# Patient Record
Sex: Male | Born: 1995 | Race: White | Hispanic: No | Marital: Single | Smoking: Current every day smoker
Health system: Southern US, Community
[De-identification: ages and names within clinical notes are randomized; demographics above are authoritative.]

---
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
## 2017-06-21 ENCOUNTER — Emergency Department: Payer: 59

## 2017-06-21 ENCOUNTER — Emergency Department
Admission: EM | Admit: 2017-06-21 | Discharge: 2017-06-21 | Disposition: A | Discharge status: Home / Self Care | Payer: 59 | Attending: Emergency Medicine | Admitting: Emergency Medicine

## 2017-06-21 ENCOUNTER — Encounter: Payer: Self-pay | Admitting: Emergency Medicine

## 2017-06-21 DIAGNOSIS — F17210 Nicotine dependence, cigarettes, uncomplicated: Secondary | ICD-10-CM | POA: Insufficient documentation

## 2017-06-21 DIAGNOSIS — N50819 Testicular pain, unspecified: Secondary | ICD-10-CM

## 2017-06-21 DIAGNOSIS — N453 Epididymo-orchitis: Secondary | ICD-10-CM | POA: Diagnosis not present

## 2017-06-21 DIAGNOSIS — N50811 Right testicular pain: Secondary | ICD-10-CM | POA: Diagnosis present

## 2017-06-21 LAB — COMPREHENSIVE METABOLIC PANEL
ALT: 20 U/L (ref 17–63)
AST: 31 U/L (ref 15–41)
Albumin: 4.3 g/dL (ref 3.5–5.0)
Alkaline Phosphatase: 80 U/L (ref 38–126)
Anion gap: 10 (ref 5–15)
BUN: 14 mg/dL (ref 6–20)
CALCIUM: 9.2 mg/dL (ref 8.9–10.3)
CHLORIDE: 105 mmol/L (ref 101–111)
CO2: 25 mmol/L (ref 22–32)
Creatinine, Ser: 0.91 mg/dL (ref 0.61–1.24)
GFR calc Af Amer: >60 mL/min (ref >60)
GFR calc non Af Amer: >60 mL/min (ref >60)
Glucose, Bld: 107 mg/dL — ABNORMAL HIGH (ref 65–99)
Potassium: 3.5 mmol/L (ref 3.5–5.1)
Sodium: 140 mmol/L (ref 135–145)
Total Bilirubin: 0.8 mg/dL (ref 0.3–1.2)
Total Protein: 7.3 g/dL (ref 6.5–8.1)

## 2017-06-21 LAB — CBC
HCT: 44.3 % (ref 40.0–52.0)
Hemoglobin: 15 g/dL (ref 13.0–18.0)
MCH: 31.6 pg (ref 26.0–34.0)
MCHC: 33.9 g/dL (ref 32.0–36.0)
MCV: 93.3 fL (ref 80.0–100.0)
Platelets: 189 10*3/uL (ref 150–440)
RBC: 4.75 MIL/uL (ref 4.40–5.90)
RDW: 13.5 % (ref 11.5–14.5)
WBC: 8.4 10*3/uL (ref 3.8–10.6)

## 2017-06-21 LAB — URINALYSIS, COMPLETE (UACMP) WITH MICROSCOPIC
Bacteria, UA: NONE SEEN
Bilirubin Urine: NEGATIVE
Glucose, UA: NEGATIVE mg/dL
KETONES UR: NEGATIVE mg/dL
Nitrite: NEGATIVE
PROTEIN: NEGATIVE mg/dL
Specific Gravity, Urine: 1.015 (ref 1.005–1.030)
pH: 5 (ref 5.0–8.0)

## 2017-06-21 LAB — CHLAMYDIA/NGC RT PCR (ARMC ONLY)
Chlamydia Tr: DETECTED — AB
N gonorrhoeae: NOT DETECTED

## 2017-06-21 LAB — LIPASE, BLOOD: LIPASE: 24 U/L (ref 11–51)

## 2017-06-21 MED ORDER — CEFTRIAXONE SODIUM 250 MG IJ SOLR
250.0000 mg | Freq: Once | INTRAMUSCULAR | Status: AC
  Administered 2017-06-21: 250 mg via INTRAMUSCULAR
  Filled 2017-06-21: qty 250

## 2017-06-21 MED ORDER — AZITHROMYCIN 500 MG PO TABS
1000.0000 mg | ORAL_TABLET | Freq: Once | ORAL | Status: AC
  Administered 2017-06-21: 1000 mg via ORAL
  Filled 2017-06-21: qty 2

## 2017-06-21 MED ORDER — DOXYCYCLINE HYCLATE 100 MG PO CAPS: 100.0000 mg | ORAL_CAPSULE | Freq: Two times a day (BID) | ORAL | 0 refills | Status: DC

## 2017-06-21 NOTE — ED Provider Notes (Signed)
[facility] Emergency Department Provider Note       Time seen: ----------------------------------------- 7:54 PM on 06/21/2017 -----------------------------------------   I have reviewed the triage vital signs and the nursing notes.  HISTORY   Chief Complaint Abdominal Pain and Groin Swelling    HPI Brandon [name] is a 22 y.o. male with  right testicular pain and concerns about possible right inguinal hernia.  Patient is states he is not having difficulty urinating but the right testicle has been tender for several days and swollen.  He reports is not swollen anymore and the pain has some improved.  He denies a history of this.  Nothing makes it better.   History reviewed. No pertinent past medical history.  There are no active problems to display for this patient.   History reviewed. No pertinent surgical history.  Allergies Patient has no known allergies.  Social History Social History   Tobacco Use  . Smoking status: Current Every Day Smoker    Packs/day: 2.00    Types: Cigarettes  . Smokeless tobacco: Never Used  Substance Use Topics  . Alcohol use: No  . Drug use: Yes    Types: Marijuana    Review of Systems Constitutional: Negative for fever. Cardiovascular: Negative for chest pain. Respiratory: Negative for shortness of breath. Gastrointestinal: Negative for abdominal pain, vomiting and diarrhea. Genitourinary: Positive for testicular pain and swelling, negative for dysuria Musculoskeletal: Negative for back pain. Skin: Negative for rash. Neurological: Negative for headaches, focal weakness or numbness.  All systems negative/normal/unremarkable except as stated in the HPI  ____________________________________________   PHYSICAL EXAM:  VITAL SIGNS: ED Triage Vitals [06/21/17 1810]  Enc Vitals Group     BP 118/65     Pulse Rate (!) 101     Resp 15     Temp 98.6 F (37 C)     Temp Source Oral     SpO2 100 %   Weight 135 lb (61.2 kg)     Height 6\' 4"  (1.93 m)     Head Circumference      Peak Flow      Pain Score 7     Pain Loc      Pain Edu?      Excl. in GC?     Constitutional: Alert and oriented. Well appearing and in no distress. Eyes: Conjunctivae are normal. Normal extraocular movements. Cardiovascular: Normal rate, regular rhythm. No murmurs, rubs, or gallops. Respiratory: Normal respiratory effort without tachypnea nor retractions. Breath sounds are clear and equal bilaterally. No wheezes/rales/rhonchi. Gastrointestinal: Soft and nontender. Normal bowel sounds Genitourinary: Mild right testicular tenderness, no inguinal hernias noted.  Normal lie, circumcised male Musculoskeletal: Nontender with normal range of motion in extremities. No lower extremity tenderness nor edema. Neurologic:  Normal speech and language. No gross focal neurologic deficits are appreciated.  Skin:  Skin is warm, dry and intact. No rash noted. Psychiatric: Mood and affect are normal. Speech and behavior are normal.  ____________________________________________  ED COURSE:  As part of my medical decision making, I reviewed the following data within the electronic MEDICAL RECORD NUMBER History obtained from family if available, nursing notes, old chart and ekg, as well as notes from prior ED visits. Patient presented for right testicular pain and swelling, we will assess with labs and imaging as indicated at this time.   Procedures ____________________________________________   LABS (pertinent positives/negatives)  Labs Reviewed  COMPREHENSIVE METABOLIC PANEL - Abnormal; Notable for the following components:  Result Value   Glucose, Bld 107 (*)    All other components within normal limits  URINALYSIS, COMPLETE (UACMP) WITH MICROSCOPIC - Abnormal; Notable for the following components:   Color, Urine YELLOW (*)    APPearance HAZY (*)    Hgb urine dipstick SMALL (*)    Leukocytes, UA MODERATE (*)     Squamous Epithelial / LPF 0-5 (*)    All other components within normal limits  CHLAMYDIA/NGC RT PCR ([facility] ONLY)  LIPASE, BLOOD  CBC    RADIOLOGY Images were viewed by me  Scrotal ultrasound IMPRESSION: 1. Negative for testicular torsion 2. Enlarged heterogeneous and hyperemic left epididymal tail, suspicious for epididymitis. 3. Small epididymal head cysts. ____________________________________________  DIFFERENTIAL DIAGNOSIS   Orchitis, epididymitis, STD, strain, ilioinguinal nerve irritation  FINAL ASSESSMENT AND PLAN  Epididymo-orchitis   Plan: Patient had presented for right-sided testicular pain. Patient's labs did reveal likely UTI or more likely epididymo-orchitis. Patient's imaging did reveal inflammation of the left epididymal tail.  He was given Rocephin and Zithromax and will be discharged with doxycycline.  I have advised this is likely either gonorrhea or chlamydia.  He is stable for outpatient follow-up.   Emily FilbertWilliams, Arretta Toenjes E, MD   Note: This note was generated in part or whole with voice recognition software. Voice recognition is usually quite accurate but there are transcription errors that can and very often do occur. I apologize for any typographical errors that were not detected and corrected.     Emily FilbertWilliams, Katianne Barre E, MD 06/21/17 2055

## 2017-06-21 NOTE — ED Notes (Signed)
Reviewed discharge instructions, follow-up care, and prescriptions with patient. Patient verbalized understanding of all information reviewed. Patient stable, with no distress noted at this time.    

## 2017-06-21 NOTE — ED Triage Notes (Signed)
Pt comes into the ED via POV c/o LLQ abdominal pain and groin swelling that started 2 days ago.  Patient is concerned he may have a hernia although denies any bulge or injury that could have caused it.  Patient denies any N/V/D or difficulty urinating.  Patient in NAD at this time with even and unlabored respirations.

## 2017-06-21 NOTE — ED Notes (Signed)
Patient transported to Ultrasound 

## 2017-06-22 ENCOUNTER — Telehealth: Payer: Self-pay | Admitting: Emergency Medicine

## 2017-06-22 NOTE — Telephone Encounter (Signed)
Called patient to inform of positive chlamydia test.  He was treated during his visit.  I was unable to reach at his numbers.  Will send letter.

## 2019-09-23 ENCOUNTER — Emergency Department: Payer: Medicaid Other

## 2019-09-23 ENCOUNTER — Encounter: Payer: Self-pay | Admitting: Emergency Medicine

## 2019-09-23 ENCOUNTER — Other Ambulatory Visit: Payer: Self-pay

## 2019-09-23 ENCOUNTER — Emergency Department
Admission: EM | Admit: 2019-09-23 | Discharge: 2019-09-23 | Disposition: A | Discharge status: Home / Self Care | Payer: Medicaid Other | Attending: Emergency Medicine | Admitting: Emergency Medicine

## 2019-09-23 DIAGNOSIS — M25562 Pain in left knee: Secondary | ICD-10-CM | POA: Insufficient documentation

## 2019-09-23 MED ORDER — KETOROLAC TROMETHAMINE 30 MG/ML IJ SOLN
30.0000 mg | Freq: Once | INTRAMUSCULAR | Status: AC
  Administered 2019-09-23: 30 mg via INTRAMUSCULAR
  Filled 2019-09-23: qty 1

## 2019-09-23 NOTE — ED Provider Notes (Signed)
Emergency Department Provider Note  ____________________________________________  Time seen: Approximately 4:52 PM  I have reviewed the triage vital signs and the nursing notes.   HISTORY  Chief Complaint Knee Pain   Historian Patient    HPI Brandon [name] is a 24 y.o. male presents to the emergency department with acute left knee pain.  Patient states that he was playing with his dogs yesterday and felt a pop.  Patient has had difficulty ambulating since injury occurred.  No prior meniscal or ACL/PCL injury in the past.  No numbness or tingling down the left lower extremity.  No prior left knee issues.  No other alleviating measures have been attempted.    History reviewed. No pertinent past medical history.   Immunizations up to date:  Yes.     History reviewed. No pertinent past medical history.  There are no problems to display for this patient.   History reviewed. No pertinent surgical history.  Prior to Admission medications   Not on File    Allergies Patient has no known allergies.  No family history on file.  Social History Social History   Tobacco Use  . Smoking status: Current Every Day Smoker    Packs/day: 2.00    Types: Cigarettes  . Smokeless tobacco: Never Used  Substance Use Topics  . Alcohol use: No  . Drug use: Yes    Types: Marijuana     Review of Systems  Constitutional: No fever/chills Eyes:  No discharge ENT: No upper respiratory complaints. Respiratory: no cough. No SOB/ use of accessory muscles to breath Gastrointestinal:   No nausea, no vomiting.  No diarrhea.  No constipation. Musculoskeletal: Patient has left knee pain.  Skin: Negative for rash, abrasions, lacerations, ecchymosis.    ____________________________________________   PHYSICAL EXAM:  VITAL SIGNS: ED Triage Vitals  Enc Vitals Group     BP 09/23/19 1537 132/71     Pulse Rate 09/23/19 1537 (!) 102     Resp 09/23/19 1537 18     Temp 09/23/19  1537 99.2 F (37.3 C)     Temp Source 09/23/19 1537 Oral     SpO2 09/23/19 1537 98 %     Weight 09/23/19 1532 140 lb (63.5 kg)     Height 09/23/19 1532 6\' 1"  (1.854 m)     Head Circumference --      Peak Flow --      Pain Score 09/23/19 1531 8     Pain Loc --      Pain Edu? --      Excl. in GC? --      Constitutional: Alert and oriented. Well appearing and in no acute distress. Eyes: Conjunctivae are normal. PERRL. EOMI. Head: Atraumatic. Cardiovascular: Normal rate, regular rhythm. Normal S1 and S2.  Good peripheral circulation. Respiratory: Normal respiratory effort without tachypnea or retractions. Lungs CTAB. Good air entry to the bases with no decreased or absent breath sounds Gastrointestinal: Bowel sounds x 4 quadrants. Soft and nontender to palpation. No guarding or rigidity. No distention. Musculoskeletal: Provocative testing is limited due to pain.  Patient has tenderness along the lateral aspect of the left knee.  Palpable dorsalis pedis pulse, left. Neurologic:  Normal for age. No gross focal neurologic deficits are appreciated.  Skin:  Skin is warm, dry and intact. No rash noted. Psychiatric: Mood and affect are normal for age. Speech and behavior are normal.   ____________________________________________   LABS (all labs ordered are listed, but only abnormal results are displayed)  Labs Reviewed - No data to display ____________________________________________  EKG   ____________________________________________  RADIOLOGY Unk Pinto, personally viewed and evaluated these images (plain radiographs) as part of my medical decision making, as well as reviewing the written report by the radiologist.  DG Knee Complete 4 Views Left  Result Date: 09/23/2019 CLINICAL DATA:  Knee pain EXAM: LEFT KNEE - COMPLETE 4+ VIEW COMPARISON:  None. FINDINGS: No fracture or malalignment. Probable bone island in the proximal tibia. The joint spaces are maintained. No  significant knee effusion. IMPRESSION: Negative. Electronically Signed   By: Donavan Foil M.D.   On: 09/23/2019 16:47    ____________________________________________    PROCEDURES  Procedure(s) performed:     Procedures     Medications  ketorolac (TORADOL) 30 MG/ML injection 30 mg (30 mg Intramuscular Given 09/23/19 1704)     ____________________________________________   INITIAL IMPRESSION / ASSESSMENT AND PLAN / ED COURSE  Pertinent labs & imaging results that were available during my care of the patient were reviewed by me and considered in my medical decision making (see chart for details).      Assessment and plan:  Knee pain 24 year old male presents to the emergency department with acute left knee pain.  X-ray examination revealed no bony abnormality.  Patient was wearing a knee sleeve when he came into the emergency department.  He was given an injection of Toradol.  He was discharged with Toradol and advised to follow-up with orthopedics.  ____________________________________________  FINAL CLINICAL IMPRESSION(S) / ED DIAGNOSES  Final diagnoses:  Acute pain of left knee      NEW MEDICATIONS STARTED DURING THIS VISIT:  ED Discharge Orders    None          This chart was dictated using voice recognition software/Dragon. Despite best efforts to proofread, errors can occur which can change the meaning. Any change was purely unintentional.     Lannie Fields, PA-C 09/23/19 1807    Arta Silence, MD 09/26/19 [phone]

## 2019-09-23 NOTE — ED Triage Notes (Signed)
Patient presents to the ED with left knee pain that began yesterday after patient was playing with his dogs.  Patient denies known injury.  Patient is ambulatory with limp.  No obvious distress.

## 2019-09-23 NOTE — ED Notes (Signed)
See triage note  Presents with pain to left knee   Stats pain started yesterday  States he noticed pain after playing with his dogs

## 2022-02-14 IMAGING — DX DG KNEE COMPLETE 4+V*L*
4 series · 4 of 4 positions shown · non-contrast
Comparison: None.

CLINICAL DATA: Knee pain

EXAM:
LEFT KNEE - COMPLETE 4+ VIEW

[knee ap]
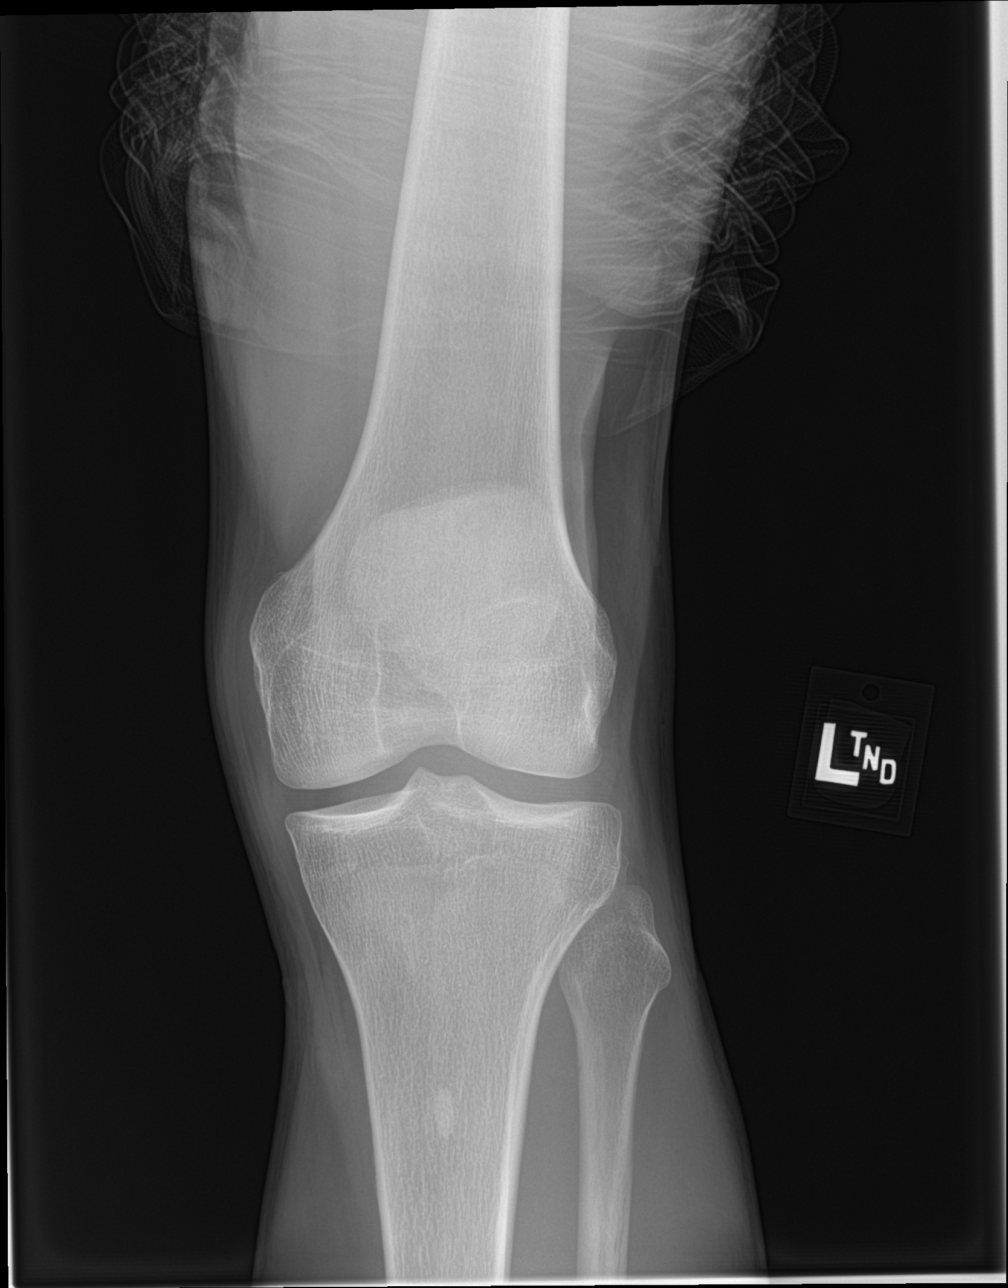

[knee lat]
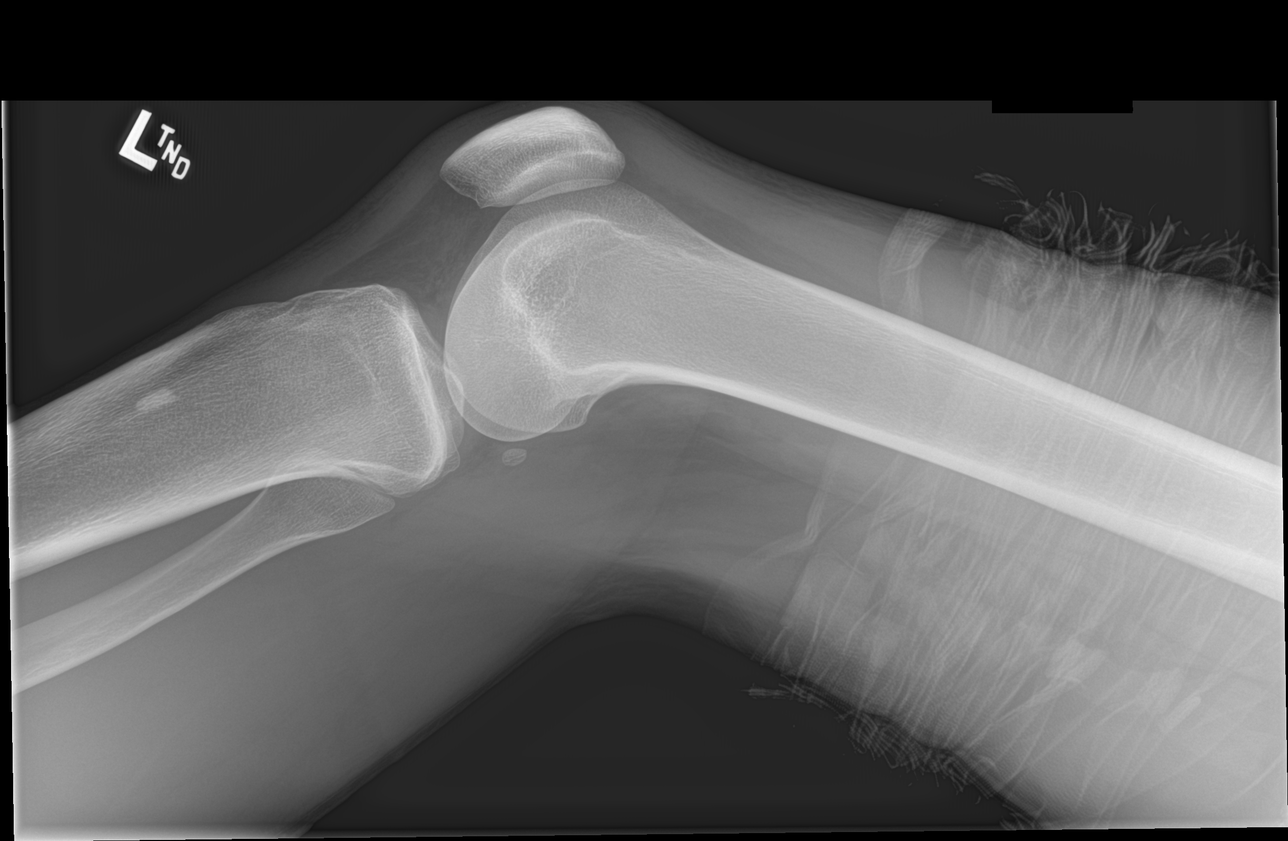

[knee obl (1 of 2)]
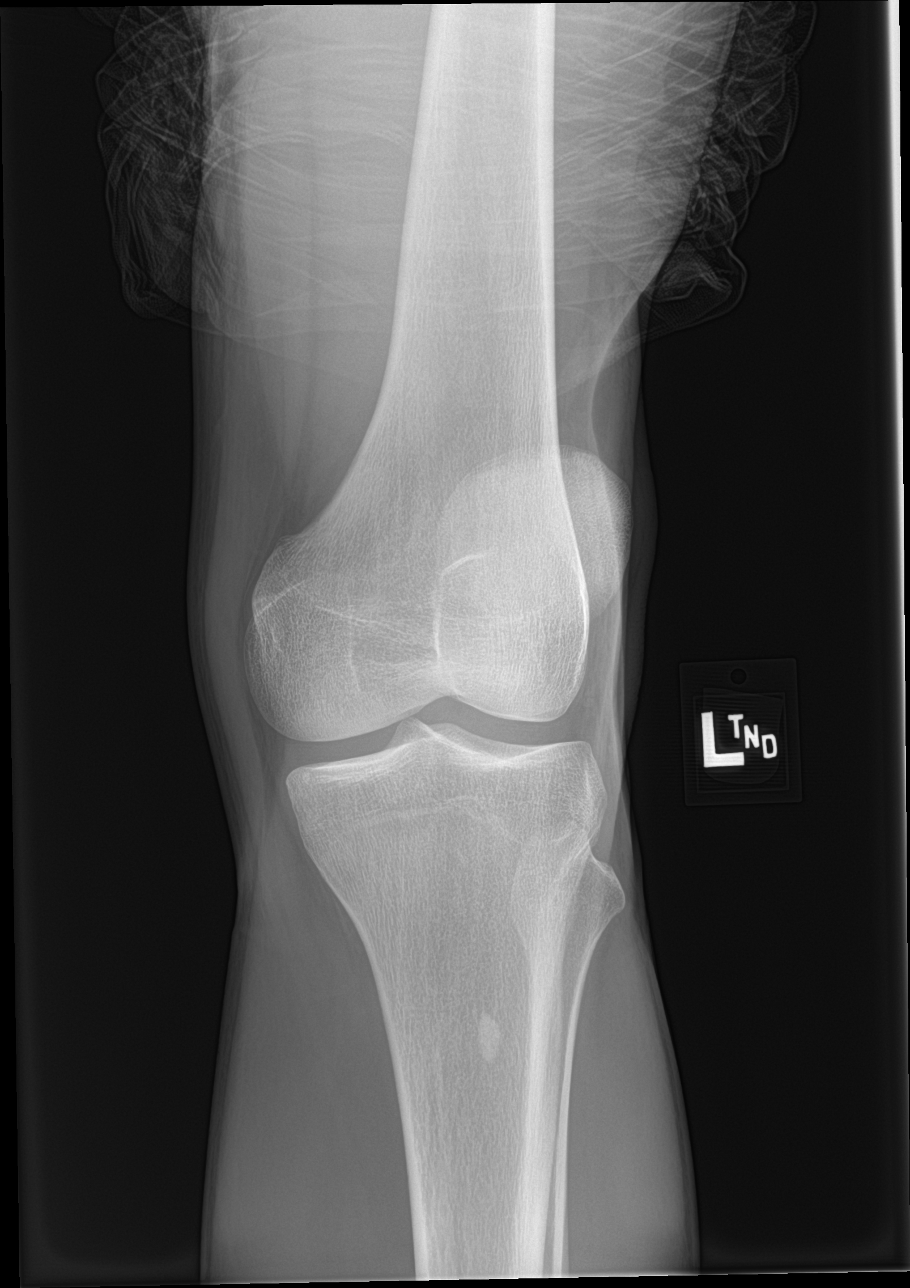

[knee obl (2 of 2)]
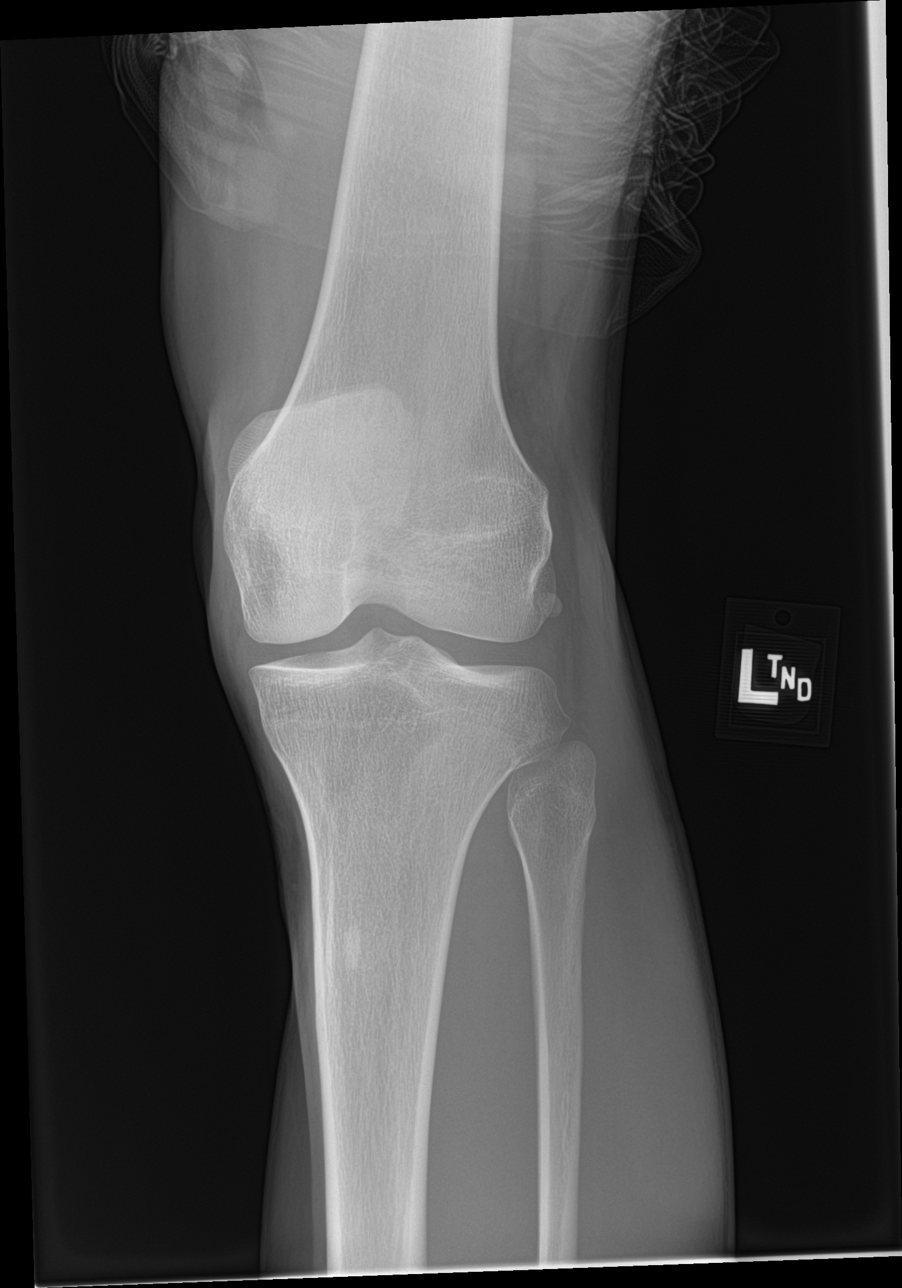

[4 of 4 positions shown; findings below may reference images not displayed]

FINDINGS: No fracture or malalignment. Probable bone island in the proximal
tibia. The joint spaces are maintained. No significant knee
effusion.
IMPRESSION: Negative.

## 2022-03-25 ENCOUNTER — Emergency Department
Admission: EM | Admit: 2022-03-25 | Discharge: 2022-03-25 | Disposition: A | Discharge status: Home / Self Care | Payer: Medicaid Other | Attending: Emergency Medicine | Admitting: Emergency Medicine

## 2022-03-25 DIAGNOSIS — T782XXA Anaphylactic shock, unspecified, initial encounter: Secondary | ICD-10-CM | POA: Insufficient documentation

## 2022-03-25 DIAGNOSIS — R21 Rash and other nonspecific skin eruption: Secondary | ICD-10-CM | POA: Diagnosis present

## 2022-03-25 MED ORDER — EPINEPHRINE 0.3 MG/0.3ML IJ SOAJ: 0.3000 mg | INTRAMUSCULAR | 0 refills | Status: AC | PRN

## 2022-03-25 MED ORDER — SODIUM CHLORIDE 0.9 % IV BOLUS
1000.0000 mL | Freq: Once | INTRAVENOUS | Status: AC
  Administered 2022-03-25: 1000 mL via INTRAVENOUS

## 2022-03-25 MED ORDER — PREDNISONE 50 MG PO TABS: ORAL_TABLET | ORAL | 0 refills | Status: AC

## 2022-03-25 MED ORDER — FAMOTIDINE IN NACL 20-0.9 MG/50ML-% IV SOLN
20.0000 mg | Freq: Once | INTRAVENOUS | Status: AC
  Administered 2022-03-25: 20 mg via INTRAVENOUS
  Filled 2022-03-25: qty 50

## 2022-03-25 MED ORDER — METHYLPREDNISOLONE SODIUM SUCC 125 MG IJ SOLR
125.0000 mg | Freq: Once | INTRAMUSCULAR | Status: AC
  Administered 2022-03-25: 125 mg via INTRAVENOUS
  Filled 2022-03-25: qty 2

## 2022-03-25 MED ORDER — DIPHENHYDRAMINE HCL 50 MG/ML IJ SOLN
25.0000 mg | Freq: Once | INTRAMUSCULAR | Status: AC
  Administered 2022-03-25: 25 mg via INTRAVENOUS
  Filled 2022-03-25: qty 1

## 2022-03-25 MED ORDER — EPINEPHRINE 0.3 MG/0.3ML IJ SOAJ
INTRAMUSCULAR | Status: AC
  Administered 2022-03-25: 0.3 mg via INTRAMUSCULAR
  Filled 2022-03-25: qty 0.3

## 2022-03-25 MED ORDER — EPINEPHRINE 0.3 MG/0.3ML IJ SOAJ: 0.3000 mg | INTRAMUSCULAR | Status: AC

## 2022-03-25 NOTE — ED Triage Notes (Signed)
Stung by wasp on neck apx 1 hr ago. 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  No epi pen at home to use. Support person in room states pt speech is altered and pt reports difficulty swallowing.

## 2022-03-25 NOTE — Discharge Instructions (Signed)
Take benadryl 25 mg every 6 hours as needed for itching and allergies.  Take prednisone for 4 days as prescribed.  Pick up EpiPen at pharmacy and use as directed.   Thank you for choosing [place] for your health care today!  Please see your primary doctor this week for a follow up appointment.   If you do not have a primary doctor call the following clinics to establish care:  If you have insurance:  [facility] [phone] [place] [address]   [facility]  [phone] [address] [place] [address]   If you do not have insurance:  [facility]  [phone] [address] [place] [address]  Sometimes, in the early stages of certain disease courses it is difficult to detect in the emergency department evaluation -- so, it is important that you continue to monitor your symptoms and call your doctor right away or return to the emergency department if you develop any new or worsening symptoms.  It was my pleasure to care for you today.   Hoover Brunette Jacelyn Grip, MD

## 2022-03-25 NOTE — ED Provider Notes (Addendum)
Care assumed by Dr. Jacelyn Grip anaphylactic reaction to bee sting.  Patient was given epinephrine at 5 AM.  Plan for 4-hour observation and discharged with epinephrine and steroids.  Feeling better.  Stable for discharge home.  Given instructions on how to use the EpiPen and return precautions.   Nathaniel Man, MD 03/25/22 3335    Nathaniel Man, MD 03/25/22 [phone]

## 2022-03-25 NOTE — ED Provider Notes (Signed)
[facility] Provider Note    Event Date/Time   First MD Initiated Contact with Patient 03/25/22 [phone]     (approximate)   History   Allergic Reaction   HPI  Brandon [name] is a 26 y.o. male   Past medical history of allergy to bee stings who presents to the emergency department today with a bee sting to the back of his neck and itching skin rash, swelling of the lips which started approximately 1 hour prior to arrival.  He took 25 mg of Benadryl by mouth prior to arrival.  He arrives with some burning lower lip swelling, no respiratory distress, no wheezing, no apparent skin rash which he says mostly resolved after Benadryl, maintaining secretions and airway.  Was given an EpiPen.  History was obtained via patient.  Independent historian includes sister and mother for collateral info at bedside.        Physical Exam   Triage Vital Signs: ED Triage Vitals [03/25/22 0438]  Enc Vitals Group     BP 122/76     Pulse Rate (!) 124     Resp 20     Temp 98 F (36.7 C)     Temp Source Oral     SpO2 97 %     Weight 150 lb (68 kg)     Height 6\' 1"  (1.854 m)     Head Circumference      Peak Flow      Pain Score      Pain Loc      Pain Edu?      Excl. in GC?     Most recent vital signs: Vitals:   03/25/22 0600 03/25/22 0630  BP: 109/69 113/66  Pulse: 64 (!) 54  Resp: (!) 21 19  Temp:    SpO2: 98% 100%    General: Awake, no distress.  CV:  Good peripheral perfusion.  Resp:  Normal effort. No wheezing.  Abd:  No distention. nontender Other:  Upper and lower lips swollen, maintaining airway and secretions, no posterior oropharyngeal involvement   ED Results / Procedures / Treatments   Labs (all labs ordered are listed, but only abnormal results are displayed) Labs Reviewed - No data to display   PROCEDURES:  Critical Care performed: Yes, see critical care procedure note(s)  .Critical Care  Performed by: 03/27/22,  MD Authorized by: Pilar Jarvis, MD   Critical care provider statement:    Critical care time (minutes):  30   Critical care was necessary to treat or prevent imminent or life-threatening deterioration of the following conditions: anaphylaxis requiring IM epinephrine.   Critical care was time spent personally by me on the following activities:  Development of treatment plan with patient or surrogate, evaluation of patient's response to treatment, examination of patient, ordering and performing treatments and interventions and re-evaluation of patient's condition    MEDICATIONS ORDERED IN ED: Medications  diphenhydrAMINE (BENADRYL) injection 25 mg (25 mg Intravenous Given 03/25/22 0503)  methylPREDNISolone sodium succinate (SOLU-MEDROL) 125 mg/2 mL injection 125 mg (125 mg Intravenous Given 03/25/22 0503)  famotidine (PEPCID) IVPB 20 mg premix (0 mg Intravenous Stopped 03/25/22 0543)  sodium chloride 0.9 % bolus 1,000 mL (1,000 mLs Intravenous New Bag/Given 03/25/22 0508)  EPINEPHrine (EPI-PEN) injection 0.3 mg (0.3 mg Intramuscular Given 03/25/22 0500)     IMPRESSION / MDM / ASSESSMENT AND PLAN / ED COURSE  I reviewed the triage vital signs and the nursing notes.  Differential diagnosis includes, but is not limited to, anaphylactic reaction to known bee sting allergy given 2 system involvement skin and mucosal membranes  MDM: This patient with oral involvement as well as skin involvement after bee sting is treated for anaphylaxis, given EpiPen, Benadryl, Pepcid, steroids at this time along with a fluid bolus via the IV.  We will continue to monitor and reassess and observe for 4 hours post epinephrine administration.  Reassessed at 7 AM, sleeping comfortably, swelling of the lips have improved slightly.  Signed out pending observation.  Patient's presentation is most consistent with acute presentation with potential threat to life or bodily function.        FINAL CLINICAL IMPRESSION(S) / ED DIAGNOSES   Final diagnoses:  Anaphylaxis, initial encounter     Rx / DC Orders   ED Discharge Orders          Ordered    EPINEPHrine 0.3 mg/0.3 mL IJ SOAJ injection  As needed        03/25/22 0513    predniSONE (DELTASONE) 50 MG tablet        03/25/22 0347             Note:  This document was prepared using Dragon voice recognition software and may include unintentional dictation errors.    Lucillie Garfinkel, MD 03/25/22 [phone]
